# Patient Record
Sex: Male | Born: 1980 | Race: Black or African American | Hispanic: No | Marital: Single | Smoking: Never smoker
Health system: Southern US, Community
[De-identification: ages and names within clinical notes are randomized; demographics above are authoritative.]

---

## 2006-09-10 ENCOUNTER — Emergency Department (HOSPITAL_COMMUNITY): Admission: EM | Admit: 2006-09-10 | Discharge: 2006-09-10 | Payer: Self-pay | Admitting: Emergency Medicine

## 2006-09-17 ENCOUNTER — Ambulatory Visit: Payer: Self-pay | Admitting: Internal Medicine

## 2008-02-16 ENCOUNTER — Telehealth (INDEPENDENT_AMBULATORY_CARE_PROVIDER_SITE_OTHER): Payer: Self-pay | Admitting: *Deleted

## 2008-02-21 ENCOUNTER — Ambulatory Visit: Payer: Self-pay | Admitting: Internal Medicine

## 2008-02-21 DIAGNOSIS — F329 Major depressive disorder, single episode, unspecified: Secondary | ICD-10-CM

## 2008-02-21 DIAGNOSIS — K1120 Sialoadenitis, unspecified: Secondary | ICD-10-CM

## 2009-09-09 ENCOUNTER — Encounter: Payer: Self-pay | Admitting: Internal Medicine

## 2009-10-09 ENCOUNTER — Ambulatory Visit: Payer: Self-pay | Admitting: Internal Medicine

## 2009-10-09 DIAGNOSIS — K219 Gastro-esophageal reflux disease without esophagitis: Secondary | ICD-10-CM | POA: Insufficient documentation

## 2009-10-09 DIAGNOSIS — R079 Chest pain, unspecified: Secondary | ICD-10-CM | POA: Insufficient documentation

## 2009-10-09 DIAGNOSIS — F411 Generalized anxiety disorder: Secondary | ICD-10-CM | POA: Insufficient documentation

## 2010-05-01 NOTE — Assessment & Plan Note (Signed)
Summary: OV --LAST APPT W/DR JWJ:  2009---STC   Vital Signs:  Patient profile:   30 year old male Height:      70 inches Weight:      150.50 pounds BMI:     21.67 O2 Sat:      97 % on Room air Temp:     98.3 degrees F oral Pulse rate:   82 / minute BP sitting:   118 / 88  (left arm) Cuff size:   regular  Vitals Entered By: Zella Ball Ewing CMA Duncan Dull) (October 09, 2009 3:41 PM)  O2 Flow:  Room air CC: Chest problems/RE   CC:  Chest problems/RE.  History of Present Illness: here with intermittent, 2 months; mild left upper chest pain, "pinch-type" , no radiation, no worse with shoudler movement and nonpleuritic;  no nausea, sweats, palp's , diaphoresis, dizziness or syncope.  no fever, ST, or cough.Has some reflux symptoms about the same, without dysphagia, wt loss, night sweats,  or other constitituaional  symptoms.  Pain overall nonexertional.  ? maybe more stress lately.  works as Conservation officer, nature at [employer], job stable. overall doing ok socaily.  Denies incr depression, suicidal ideation, no panic.  Pt denies sob, doe, wheezing, orthopnea, pnd, worsening LE edema, palps, dizziness or syncope Pt denies new neuro symptoms such as headache, facial or extremity weakness   Preventive Screening-Counseling & Management      Drug Use:  no.    Problems Prior to Update: 1)  Anxiety  (ICD-300.00) 2)  Chest Pain  (ICD-786.50) 3)  Gerd  (ICD-530.81) 4)  Depression  (ICD-311) 5)  Parotitis, Left  (ICD-527.2)  Medications Prior to Update: 1)  Cephalexin 500 Mg Tabs (Cephalexin) .Marland Kitchen.. 1 By Mouth Three Times A Day  Current Medications (verified): 1)  Dexilant 60 Mg Cpdr (Dexlansoprazole) .Marland Kitchen.. 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   Head:  normocephalic and atraumatic.   Eyes:  vision grossly intact, pupils equal, and pupils round.   Ears:  R ear normal and L ear normal.   Nose:  no external deformity and no nasal discharge.   Mouth:  no gingival abnormalities and pharynx pink and moist.   Neck:  supple and no masses.   Lungs:  normal respiratory effort and normal breath sounds.   Heart:  normal rate and regular rhythm.   Abdomen:  soft, non-tender, and normal bowel sounds.   Msk:  no chest wall tender Extremities:  no edema, no erythema  Neurologic:  cranial nerves II-XII intact and strength normal in all extremities.   Psych:  dysphoric affect and moderately anxious.     Impression & Recommendations:  Problem # 1:  CHEST PAIN (ICD-786.50)  left upper chest, atypical, ? msk vs GI vs other - for ecg and cxr today, and trial dexliant samples, follow o/w with expectant management  Orders: EKG w/  Interpretation (93000) T-2 View CXR, Same Day (71020.5TC)  Problem # 2:  DEPRESSION (ICD-311) declines need for tx at this time  Problem # 3:  GERD (ICD-530.81)  His updated medication list for this problem includes:    Dexilant 60 Mg Cpdr (Dexlansoprazole) .Marland Kitchen... 1po once daily  - 1 mo samples given treat as above, f/u any worsening signs or symptoms   Complete Medication List: 1)  Dexilant 60 Mg Cpdr (Dexlansoprazole) .Marland Kitchen.. 1po once daily  - 1 mo samples given  Patient Instructions: 1)  you are given the samples of dexilant for reflux, to take at one per day 2)  if this seems to help, you should consider taking Prilosec OTC - 1 per  day after that 3)  Your EKG was ok today 4)  Please go to Radiology in the basement level for your X-Ray today  5)  Please schedule a follow-up appointment as needed.

## 2011-03-08 ENCOUNTER — Emergency Department (HOSPITAL_COMMUNITY): Payer: Self-pay

## 2011-03-08 ENCOUNTER — Emergency Department (HOSPITAL_COMMUNITY)
Admission: EM | Admit: 2011-03-08 | Discharge: 2011-03-08 | Disposition: A | Discharge status: Home / Self Care | Payer: Self-pay | Attending: Emergency Medicine | Admitting: Emergency Medicine

## 2011-03-08 ENCOUNTER — Encounter: Payer: Self-pay | Admitting: *Deleted

## 2011-03-08 DIAGNOSIS — M545 Low back pain, unspecified: Secondary | ICD-10-CM | POA: Insufficient documentation

## 2011-03-08 DIAGNOSIS — R109 Unspecified abdominal pain: Secondary | ICD-10-CM | POA: Insufficient documentation

## 2011-03-08 DIAGNOSIS — R110 Nausea: Secondary | ICD-10-CM | POA: Insufficient documentation

## 2011-03-08 DIAGNOSIS — F17200 Nicotine dependence, unspecified, uncomplicated: Secondary | ICD-10-CM | POA: Insufficient documentation

## 2011-03-08 DIAGNOSIS — N200 Calculus of kidney: Secondary | ICD-10-CM | POA: Insufficient documentation

## 2011-03-08 LAB — URINALYSIS, ROUTINE W REFLEX MICROSCOPIC
Hgb urine dipstick: NEGATIVE
Ketones, ur: 15 mg/dL — AB
Leukocytes, UA: NEGATIVE
Protein, ur: 30 mg/dL — AB
Specific Gravity, Urine: 1.03 (ref 1.005–1.030)
Urobilinogen, UA: 1 mg/dL (ref 0.0–1.0)
pH: 8 (ref 5.0–8.0)

## 2011-03-08 LAB — POCT I-STAT, CHEM 8
BUN: 12 mg/dL (ref 6–23)
HCT: 48 % (ref 39.0–52.0)
Hemoglobin: 16.3 g/dL (ref 13.0–17.0)
Sodium: 140 mEq/L (ref 135–145)
TCO2: 27 mmol/L (ref 0–100)

## 2011-03-08 LAB — URINE MICROSCOPIC-ADD ON

## 2011-03-08 MED ORDER — SODIUM CHLORIDE 0.9 % IV BOLUS (SEPSIS): 1000.0000 mL | Freq: Once | INTRAVENOUS | Status: DC

## 2011-03-08 MED ORDER — ONDANSETRON HCL 4 MG/2ML IJ SOLN
4.0000 mg | Freq: Once | INTRAMUSCULAR | Status: DC
  Filled 2011-03-08: qty 2

## 2011-03-08 MED ORDER — OXYCODONE-ACETAMINOPHEN 5-325 MG PO TABS
2.0000 | ORAL_TABLET | Freq: Once | ORAL | Status: AC
  Administered 2011-03-08: 2 via ORAL
  Filled 2011-03-08: qty 2

## 2011-03-08 MED ORDER — KETOROLAC TROMETHAMINE 60 MG/2ML IM SOLN: 30.0000 mg | Freq: Once | INTRAMUSCULAR | Status: AC

## 2011-03-08 MED ORDER — MORPHINE SULFATE 4 MG/ML IJ SOLN: 4.0000 mg | Freq: Once | INTRAMUSCULAR | Status: DC

## 2011-03-08 MED ORDER — KETOROLAC TROMETHAMINE 30 MG/ML IJ SOLN
30.0000 mg | Freq: Once | INTRAMUSCULAR | Status: DC
  Administered 2011-03-08: 30 mg via INTRAVENOUS
  Filled 2011-03-08: qty 1

## 2011-03-08 MED ORDER — OXYCODONE-ACETAMINOPHEN 5-325 MG PO TABS: 1.0000 | ORAL_TABLET | ORAL | Status: AC | PRN

## 2011-03-08 MED ORDER — TAMSULOSIN HCL 0.4 MG PO CAPS: 0.4000 mg | ORAL_CAPSULE | Freq: Every day | ORAL | Status: DC

## 2011-03-08 MED ORDER — ONDANSETRON 8 MG PO TBDP
8.0000 mg | ORAL_TABLET | Freq: Once | ORAL | Status: AC
  Administered 2011-03-08: 8 mg via ORAL
  Filled 2011-03-08: qty 1

## 2011-03-08 NOTE — ED Provider Notes (Signed)
History     CSN: [id number] Arrival date & time: 03/08/2011  2:14 PM   First MD Initiated Contact with Patient 03/08/11 1952      Chief Complaint  Patient presents with  . Flank Pain    Left    HPI  History provided by the patient. Patient presents with complaints of acute onset left lower back and flank pains starting about 2 hours prior to arrival. Patient denies similar symptoms in the past. Pain does not radiate. You sharp and persistent. he denies any aggravating factors. Patient has not taken anything for the pain. Patient has associated nausea but denies episodes of vomiting. he denies fever, chills, sweats, diarrhea or constipation. Patient has no other significant past medical history.   History reviewed. No pertinent past medical history.  History reviewed. No pertinent past surgical history.  History reviewed. No pertinent family history.  History  Substance Use Topics  . Smoking status: Current Everyday Smoker  . Smokeless tobacco: Not on file  . Alcohol Use: Yes      Review of Systems  Constitutional: Negative for fever and chills.  Respiratory: Negative for cough and shortness of breath.   Cardiovascular: Negative for chest pain.  Gastrointestinal: Positive for nausea. Negative for vomiting, abdominal pain, diarrhea and constipation.  Genitourinary: Positive for flank pain. Negative for dysuria, hematuria, penile pain and testicular pain.  All other systems reviewed and are negative.    Allergies  Review of patient's allergies indicates no known allergies.  Home Medications  No current outpatient prescriptions on file.  BP 133/74  Pulse 91  Temp(Src) 98.3 F (36.8 C) (Oral)  Resp 20  SpO2 100%  Physical Exam  Nursing note and vitals reviewed. Constitutional: He is oriented to person, place, and time. He appears well-developed and well-nourished. No distress.  HENT:  Head: Normocephalic.  Cardiovascular: Normal rate, regular rhythm and normal  heart sounds.   No murmur heard. Pulmonary/Chest: Effort normal and breath sounds normal.  Abdominal: Normal appearance. There is no tenderness. There is CVA tenderness. There is no rebound, no guarding, no tenderness at McBurney's point and negative Murphy's sign.  Neurological: He is alert and oriented to person, place, and time.  Skin: Skin is warm. No rash noted. No erythema.  Psychiatric: He has a normal mood and affect. His behavior is normal.    ED Course  Procedures (including critical care time)  Labs Reviewed  URINALYSIS, ROUTINE W REFLEX MICROSCOPIC - Abnormal; Notable for the following:    Ketones, ur 15 (*)    Protein, ur 30 (*)    All other components within normal limits  URINE MICROSCOPIC-ADD ON  I-STAT, CHEM 8    Results for orders placed during the hospital encounter of 03/08/11  URINALYSIS, ROUTINE W REFLEX MICROSCOPIC      Component Value Range   Color, Urine YELLOW  YELLOW    APPearance CLEAR  CLEAR    Specific Gravity, Urine 1.030  1.005 - 1.030    pH 8.0  5.0 - 8.0    Glucose, UA NEGATIVE  NEGATIVE (mg/dL)   Hgb urine dipstick NEGATIVE  NEGATIVE    Bilirubin Urine NEGATIVE  NEGATIVE    Ketones, ur 15 (*) NEGATIVE (mg/dL)   Protein, ur 30 (*) NEGATIVE (mg/dL)   Urobilinogen, UA 1.0  0.0 - 1.0 (mg/dL)   Nitrite NEGATIVE  NEGATIVE    Leukocytes, UA NEGATIVE  NEGATIVE   URINE MICROSCOPIC-ADD ON      Component Value Range   WBC,  UA 0-2  <3 (WBC/hpf)  POCT I-STAT, CHEM 8      Component Value Range   Sodium 140  135 - 145 (mEq/L)   Potassium 3.9  3.5 - 5.1 (mEq/L)   Chloride 102  96 - 112 (mEq/L)   BUN 12  6 - 23 (mg/dL)   Creatinine, Ser 9.14  0.50 - 1.35 (mg/dL)   Glucose, Bld 782 (*) 70 - 99 (mg/dL)   Calcium, Ion 9.56  2.13 - 1.32 (mmol/L)   TCO2 27  0 - 100 (mmol/L)   Hemoglobin 16.3  13.0 - 17.0 (g/dL)   HCT 08.6  57.8 - 46.9 (%)     Ct Abdomen Pelvis Wo Contrast  03/08/2011  *RADIOLOGY REPORT*  Clinical Data: Acute onset left flank pain.   CT ABDOMEN AND PELVIS WITHOUT CONTRAST 03/08/2011:  Technique:  Multidetector CT imaging of the abdomen and pelvis was performed following the standard protocol without intravenous contrast.  Comparison: None.  Findings: Approximate 3 mm calculus in the distal left ureter near the UVJ causing moderate left hydronephrosis, left renal edema, and left perinephric edema.  Numerous very small (1-2 mm) calculi in both kidneys.  No right urinary tract obstruction.  Within the limits of the unenhanced technique, no focal parenchymal abnormality involving either kidney.  Normal low dose unenhanced appearance of the liver, spleen, pancreas, and adrenal glands.  Gallbladder unremarkable by CT.  No biliary ductal dilation.  No visible atherosclerosis.  No significant lymphadenopathy.  Stomach normal in appearance.  Moderate stool within normal appearing colon.  Cecum extends into the low pelvis, with normal appearing appendix identified in the right low pelvis, just superior to the urinary bladder, filled with opaque material.  No ascites.  Urinary bladder decompressed and unremarkable.  Prostate gland seminal vesicles normal for age.  Phlebolith low in the left side of the pelvis.  Bone window images unremarkable.  Visualized lung bases clear.  Heart size normal.  IMPRESSION:  1.  Obstructing approximate 3 mm calculus in the distal left ureter near the UVJ. 2.  Multiple very small (1-2 mm) calculi in both kidneys.  Original Report Authenticated By: Arnell Sieving, M.D.     1. Kidney stone      MDM  8:10 PM patient seen and evaluated. Patient in no acute distress. Patient uncomfortable appearing.  Pt having some improvement of pain and ready to return home.       Angus Seller, [place] 03/09/11 1734

## 2011-03-08 NOTE — ED Notes (Signed)
Pt presented to ed c/o lt flank pain started approximately 4-5 hrs pta, denies nausea (-) vomiting, deneis urinary discomfort

## 2011-03-08 NOTE — ED Notes (Signed)
Pt with sudden onset of left flank pain an hour ago

## 2011-03-10 NOTE — ED Provider Notes (Signed)
Medical screening examination/treatment/procedure(s) were performed by non-physician practitioner and as supervising physician I was immediately available for consultation/collaboration.   Laray Anger, DO 03/10/11 [phone]

## 2021-04-19 ENCOUNTER — Emergency Department (HOSPITAL_COMMUNITY)
Admission: EM | Admit: 2021-04-19 | Discharge: 2021-04-19 | Disposition: A | Discharge status: Home / Self Care | Payer: Self-pay | Attending: Emergency Medicine | Admitting: Emergency Medicine

## 2021-04-19 ENCOUNTER — Encounter (HOSPITAL_COMMUNITY): Payer: Self-pay

## 2021-04-19 ENCOUNTER — Emergency Department (HOSPITAL_COMMUNITY): Payer: Self-pay

## 2021-04-19 ENCOUNTER — Other Ambulatory Visit: Payer: Self-pay

## 2021-04-19 DIAGNOSIS — R509 Fever, unspecified: Secondary | ICD-10-CM | POA: Insufficient documentation

## 2021-04-19 DIAGNOSIS — Z79899 Other long term (current) drug therapy: Secondary | ICD-10-CM | POA: Insufficient documentation

## 2021-04-19 DIAGNOSIS — N200 Calculus of kidney: Secondary | ICD-10-CM | POA: Insufficient documentation

## 2021-04-19 LAB — COMPREHENSIVE METABOLIC PANEL
ALT: 29 U/L (ref 0–44)
AST: 23 U/L (ref 15–41)
Albumin: 4.5 g/dL (ref 3.5–5.0)
Alkaline Phosphatase: 105 U/L (ref 38–126)
Anion gap: 10 (ref 5–15)
BUN: 15 mg/dL (ref 6–20)
CO2: 24 mmol/L (ref 22–32)
Calcium: 8.9 mg/dL (ref 8.9–10.3)
Chloride: 100 mmol/L (ref 98–111)
Creatinine, Ser: 1.02 mg/dL (ref 0.61–1.24)
GFR, Estimated: >60 mL/min (ref >60)
Glucose, Bld: 100 mg/dL — ABNORMAL HIGH (ref 70–99)
Potassium: 3.7 mmol/L (ref 3.5–5.1)
Sodium: 134 mmol/L — ABNORMAL LOW (ref 135–145)
Total Bilirubin: 0.9 mg/dL (ref 0.3–1.2)
Total Protein: 7.4 g/dL (ref 6.5–8.1)

## 2021-04-19 LAB — URINALYSIS, ROUTINE W REFLEX MICROSCOPIC
Bilirubin Urine: NEGATIVE
Glucose, UA: NEGATIVE mg/dL
Ketones, ur: 5 mg/dL — AB
Leukocytes,Ua: NEGATIVE
Nitrite: NEGATIVE
Protein, ur: 30 mg/dL — AB
RBC / HPF: >50 RBC/hpf — ABNORMAL HIGH (ref 0–5)
Specific Gravity, Urine: 1.017 (ref 1.005–1.030)
pH: 5 (ref 5.0–8.0)

## 2021-04-19 LAB — CBC WITH DIFFERENTIAL/PLATELET
Abs Immature Granulocytes: 0.06 10*3/uL (ref 0.00–0.07)
Basophils Absolute: 0.1 10*3/uL (ref 0.0–0.1)
Basophils Relative: 0 %
Eosinophils Absolute: 0.2 10*3/uL (ref 0.0–0.5)
Eosinophils Relative: 2 %
HCT: 45.2 % (ref 39.0–52.0)
Hemoglobin: 14.6 g/dL (ref 13.0–17.0)
Immature Granulocytes: 1 %
Lymphocytes Relative: 17 %
Lymphs Abs: 2.2 10*3/uL (ref 0.7–4.0)
MCH: 27.7 pg (ref 26.0–34.0)
MCHC: 32.3 g/dL (ref 30.0–36.0)
MCV: 85.6 fL (ref 80.0–100.0)
Monocytes Absolute: 0.6 10*3/uL (ref 0.1–1.0)
Monocytes Relative: 5 %
Neutro Abs: 9.9 10*3/uL — ABNORMAL HIGH (ref 1.7–7.7)
Neutrophils Relative %: 75 %
Platelets: 292 10*3/uL (ref 150–400)
RBC: 5.28 MIL/uL (ref 4.22–5.81)
RDW: 13.3 % (ref 11.5–15.5)
WBC: 13 10*3/uL — ABNORMAL HIGH (ref 4.0–10.5)
nRBC: 0 % (ref 0.0–0.2)

## 2021-04-19 MED ORDER — ONDANSETRON HCL 4 MG PO TABS: 4.0000 mg | ORAL_TABLET | Freq: Four times a day (QID) | ORAL | 0 refills | Status: AC

## 2021-04-19 MED ORDER — TAMSULOSIN HCL 0.4 MG PO CAPS: 0.4000 mg | ORAL_CAPSULE | Freq: Every day | ORAL | 0 refills | Status: AC

## 2021-04-19 MED ORDER — HYDROCODONE-ACETAMINOPHEN 5-325 MG PO TABS: 1.0000 | ORAL_TABLET | Freq: Four times a day (QID) | ORAL | 0 refills | Status: AC | PRN

## 2021-04-19 NOTE — Discharge Instructions (Addendum)
As we discussed your work-up today was significant for a 3 mm stone that is forming in the bottom of your left kidney, that should pass through your urine in the next few days. Please use Tylenol or ibuprofen for pain.  You may use 600 mg ibuprofen every 6 hours or 1000 mg of Tylenol every 6 hours.  You may choose to alternate between the 2.  This would be most effective.  Not to exceed 4 g of Tylenol within 24 hours.  Not to exceed 3200 mg ibuprofen 24 hours.  In addition to the above, you can use Norco in place of Tylenol as needed for breakthrough pain up to every 6 hours.  Please take the Flomax medication daily until you passed the stone, and follow-up with urology.  I have also provided some Zofran in case you have nausea.  Please return to the emergency department if you have significant worsening of your pain, inability urinate for greater than 8 to 12 hours despite feeling the need to.

## 2021-04-19 NOTE — ED Provider Notes (Signed)
[facility]-EMERGENCY DEPT Provider Note   CSN: [id number] Arrival date & time: 04/19/21  0402     History  Chief Complaint  Patient presents with   Flank Pain    Dan DuhamelKareem L Hall is a 41 y.o. male with past medical history significant for previous kidney stone who presents with left flank pain beginning at 230 this morning.  Patient reports that he does not have any nausea, vomiting, fever, chills associated with this.  Patient reports that it feels like previous kidney stones.  Patient reports he has been able to urinate with minimal pain, has actively passed urine since this pain began.  Patient denies some dark possible blood clots versus gross blood in urine.  Denies penile discharge, dyspareunia.  Patient denies diarrhea, constipation, chest pain, shortness of breath.   Flank Pain      Home Medications Prior to Admission medications   Medication Sig Start Date End Date Taking? Authorizing Provider  HYDROcodone-acetaminophen (NORCO/VICODIN) 5-325 MG tablet Take 1-2 tablets by mouth every 6 (six) hours as needed. 04/19/21  Yes Charlee Squibb H, PA-C  ondansetron (ZOFRAN) 4 MG tablet Take 1 tablet (4 mg total) by mouth every 6 (six) hours. 04/19/21  Yes Shawndra Clute H, PA-C  tamsulosin (FLOMAX) 0.4 MG CAPS capsule Take 1 capsule (0.4 mg total) by mouth daily. 04/19/21  Yes Jazmon Kos H, PA-C      Allergies    Patient has no known allergies.    Review of Systems   Review of Systems  Genitourinary:  Positive for flank pain.  All other systems reviewed and are negative.  Physical Exam Updated Vital Signs BP 118/74    Pulse 74    Temp 98.2 F (36.8 C) (Oral)    Resp 16    Ht 5\' 10"  (1.778 m)    Wt 68 kg    SpO2 99%    BMI 21.52 kg/m  Physical Exam Vitals and nursing note reviewed.  Constitutional:      General: He is not in acute distress.    Appearance: Normal appearance.     Comments: Well-appearing patient, no acute distress.   HENT:     Head: Normocephalic and atraumatic.  Eyes:     General:        Right eye: No discharge.        Left eye: No discharge.  Cardiovascular:     Rate and Rhythm: Normal rate and regular rhythm.     Heart sounds: No murmur heard.   No friction rub. No gallop.  Pulmonary:     Effort: Pulmonary effort is normal.     Breath sounds: Normal breath sounds.  Abdominal:     General: Bowel sounds are normal.     Palpations: Abdomen is soft.     Comments: Minimal tenderness to palpation left flank, no true CVA tenderness  Skin:    General: Skin is warm and dry.     Capillary Refill: Capillary refill takes less than 2 seconds.  Neurological:     Mental Status: He is alert and oriented to person, place, and time.  Psychiatric:        Mood and Affect: Mood normal.        Behavior: Behavior normal.    ED Results / Procedures / Treatments   Labs (all labs ordered are listed, but only abnormal results are displayed) Labs Reviewed  URINALYSIS, ROUTINE W REFLEX MICROSCOPIC - Abnormal; Notable for the following components:  Result Value   APPearance HAZY (*)    Hgb urine dipstick LARGE (*)    Ketones, ur 5 (*)    Protein, ur 30 (*)    RBC / HPF >50 (*)    Bacteria, UA RARE (*)    All other components within normal limits  COMPREHENSIVE METABOLIC PANEL - Abnormal; Notable for the following components:   Sodium 134 (*)    Glucose, Bld 100 (*)    All other components within normal limits  CBC WITH DIFFERENTIAL/PLATELET - Abnormal; Notable for the following components:   WBC 13.0 (*)    Neutro Abs 9.9 (*)    All other components within normal limits    EKG None  Radiology CT Renal Stone Study  Result Date: 04/19/2021 CLINICAL DATA:  Flank pain. EXAM: CT ABDOMEN AND PELVIS WITHOUT CONTRAST TECHNIQUE: Multidetector CT imaging of the abdomen and pelvis was performed following the standard protocol without IV contrast. RADIATION DOSE REDUCTION: This exam was performed according  to the departmental dose-optimization program which includes automated exposure control, adjustment of the mA and/or kV according to patient size and/or use of iterative reconstruction technique. COMPARISON:  CT AP, 03/08/2011. FINDINGS: Lower chest: No acute abnormality. Hepatobiliary: No focal liver abnormality is seen. No gallstones, gallbladder wall thickening, or biliary dilatation. Pancreas: No pancreatic ductal dilatation or surrounding inflammatory changes. Spleen: Normal in size without focal abnormality. Adrenals/Urinary Tract: *Adrenal glands are unremarkable. *Punctate, bilateral nephrolith largest at the LEFT inferior renal collecting system and measuring 3 mm *Kidneys are otherwise normal, without focal lesion, or hydronephrosis. *Bladder is unremarkable. Stomach/Bowel: Stomach is within normal limits. Appendix appears normal. No evidence of bowel wall thickening, distention, or inflammatory changes. Vascular/Lymphatic: No significant vascular findings are present. No enlarged abdominal or pelvic lymph nodes. Reproductive: Prostate is unremarkable.  LEFT pelvic phleboliths. Other: No abdominal wall hernia or abnormality. No abdominopelvic ascites. Musculoskeletal: No acute osseous findings. IMPRESSION: 1. Punctate, bilateral nonobstructive nephrolithiasis. 2. Largest calculus is within the LEFT inferior renal collecting system, and measures up to 3 mm. Electronically Signed   By: Roanna Banning M.D.   On: 04/19/2021 06:36    Procedures Procedures    Medications Ordered in ED Medications - No data to display  ED Course/ Medical Decision Making/ A&P                           Medical Decision Making Amount and/or Complexity of Data Reviewed Labs: ordered.  Risk Prescription drug management.   The patient presents with left flank, abdominal pain since 230 this morning.  They are overall well-appearing, not in acute pain at time of my exam.  Additional history obtained from friend.  My  differential diagnosis includes nephrolithiasis versus pyelonephritis versus diverticulitis versus other acute or surgical abdominal problem versus lower lobar pneumonia.  Less clinical concern for ACS without chest pain, exertional symptoms.  This is not an exhaustive differential.  I personally obtained and reviewed lab work which is significant for CBC which shows leukocytosis to 13, slight neutrophil predominance.  CMP with sodium of 134, glucose of 100, otherwise unremarkable.  Urinalysis is significant for large hemoglobin, rare bacteria without white blood cells or nitrates or leukocytes I do not believe this represents a bacterial infection.  Personally ordered and reviewed CT renal stone study which shows a 3 mm stone in the left inferior collecting system.  No evidence of obstructive symptoms.  Believe this represents an early kidney stone, with  imminent passage to the ureter.  We will treat as such.  No evidence of secondary infection.  Patient not in any pain at time of reevaluation.  Will discharge with some narcotic pain medication for easy passage, Flomax, Zofran.  Encourage follow-up with urology.  Encouraged return for obstructive uropathy, worsening pain, delayed passage.  Patient discharged in stable condition at this time, return precautions given. Final Clinical Impression(s) / ED Diagnoses Final diagnoses:  Nephrolithiasis    Rx / DC Orders ED Discharge Orders          Ordered    ondansetron (ZOFRAN) 4 MG tablet  Every 6 hours        04/19/21 0730    tamsulosin (FLOMAX) 0.4 MG CAPS capsule  Daily        04/19/21 0730    HYDROcodone-acetaminophen (NORCO/VICODIN) 5-325 MG tablet  Every 6 hours PRN        04/19/21 0730              Brayten Komar, [place] H, PA-C 04/19/21 0735    Cathren Laine, MD 04/19/21 1526

## 2021-04-19 NOTE — ED Triage Notes (Signed)
Pt reports left flank pain beginning 0230 this morning. Hx kidney stones.

## 2021-10-28 ENCOUNTER — Emergency Department (HOSPITAL_COMMUNITY)
Admission: EM | Admit: 2021-10-28 | Discharge: 2021-10-28 | Disposition: A | Discharge status: Home / Self Care | Payer: Self-pay | Attending: Emergency Medicine | Admitting: Emergency Medicine

## 2021-10-28 ENCOUNTER — Other Ambulatory Visit: Payer: Self-pay

## 2021-10-28 ENCOUNTER — Encounter (HOSPITAL_COMMUNITY): Payer: Self-pay

## 2021-10-28 DIAGNOSIS — H00011 Hordeolum externum right upper eyelid: Secondary | ICD-10-CM

## 2021-10-28 MED ORDER — ERYTHROMYCIN 5 MG/GM OP OINT: TOPICAL_OINTMENT | OPHTHALMIC | 0 refills | Status: DC

## 2021-10-28 NOTE — Discharge Instructions (Signed)
You were seen in the emergency department for a hordeolum. I have prescribed you erythromycin ointment for you to use over the eyelid. Additionally, please practice good hand hygiene, avoid any eye makeup or contacts and use warm compresses to the eyelid multiple times a day with gentle massage. Please return for significantly worsening redness and swelling or changes to your vision.

## 2021-10-28 NOTE — ED Triage Notes (Signed)
Patient has a raised area and redness to the right upperlid x 3 days.

## 2021-10-28 NOTE — ED Provider Triage Note (Signed)
Emergency Medicine Provider Triage Evaluation Note  Dan Hall , a 41 y.o. male  was evaluated in triage.  Pt complains of right eye pain. Began on Friday. Then woke up Saturday with redness and pain to the upper eyelid. No eye drainage, itching, fevers, or pain to the left eye. No FB sensation.  Review of Systems  Positive:  Negative:   Physical Exam  BP 123/76 (BP Location: Right Arm)   Pulse 77   Temp 98.2 F (36.8 C) (Oral)   Resp 18   Ht 5\' 10"  (1.778 m)   Wt 63.5 kg   SpO2 99%   BMI 20.09 kg/m  Gen:   Awake, no distress   Resp:  Normal effort  MSK:   Moves extremities without difficulty  Other:  Hordeolum to right upper eyelid   Medical Decision Making  Medically screening exam initiated at 1:55 PM.  Appropriate orders placed.  was informed that the remainder of the evaluation will be completed by another provider, this initial triage assessment does not replace that evaluation, and the importance of remaining in the ED until their evaluation is complete.     Mickel Duhamel, PA-C 10/28/21 1357

## 2021-10-28 NOTE — ED Provider Notes (Signed)
[facility]-EMERGENCY DEPT Provider Note   CSN: [id number] Arrival date & time: 10/28/21  1243     History  Chief Complaint  Patient presents with   Eye Pain    Dan Hall is a 41 y.o. male.  With no pertinent past medical history presents to the emergency department with right eye pain.  Patient states that the pain began on the upper eyelid of the right eye on Friday.  He states that he then woke up on Saturday morning with redness and swelling to the upper eyelid.  He has noticed a bump on the area that is tender to palpation.  He denies any eye drainage, itching, fevers or foreign body sensation.  He denies any visual changes.  HPI     Home Medications Prior to Admission medications   Medication Sig Start Date End Date Taking? Authorizing Provider  HYDROcodone-acetaminophen (NORCO/VICODIN) 5-325 MG tablet Take 1-2 tablets by mouth every 6 (six) hours as needed. 04/19/21   Prosperi, Christian H, PA-C  ondansetron (ZOFRAN) 4 MG tablet Take 1 tablet (4 mg total) by mouth every 6 (six) hours. 04/19/21   Prosperi, Christian H, PA-C  tamsulosin (FLOMAX) 0.4 MG CAPS capsule Take 1 capsule (0.4 mg total) by mouth daily. 04/19/21   Prosperi, Christian H, PA-C      Allergies    Patient has no known allergies.    Review of Systems   Review of Systems  Eyes:  Positive for pain and redness.  All other systems reviewed and are negative.   Physical Exam Updated Vital Signs BP 123/76 (BP Location: Right Arm)   Pulse 77   Temp 98.2 F (36.8 C) (Oral)   Resp 18   Ht 5\' 10"  (1.778 m)   Wt 63.5 kg   SpO2 99%   BMI 20.09 kg/m  Physical Exam Vitals and nursing note reviewed.  HENT:     Head: Normocephalic and atraumatic.  Eyes:     General: No scleral icterus.       Right eye: Hordeolum present.     Comments: Hordeolum to the right upper lid  Pulmonary:     Effort: Pulmonary effort is normal. No respiratory distress.  Skin:    Findings: No rash.   Neurological:     General: No focal deficit present.     Mental Status: He is alert.  Psychiatric:        Mood and Affect: Mood normal.        Behavior: Behavior normal.        Thought Content: Thought content normal.        Judgment: Judgment normal.    ED Results / Procedures / Treatments   Labs (all labs ordered are listed, but only abnormal results are displayed) Labs Reviewed - No data to display  EKG None  Radiology No results found.  Procedures Procedures    Medications Ordered in ED Medications - No data to display  ED Course/ Medical Decision Making/ A&P                           Medical Decision Making Risk Prescription drug management.  This patient presents to the ED for concern of eye pain, this involves an extensive number of treatment options, and is a complaint that carries with it a high risk of complications and morbidity.  The differential diagnosis includes conjunctivitis, corneal abrasion, corneal ulcer, stye, chalazion, acute angle-closure glaucoma, cataract, hypopyon, hyphema,  globe rupture, foreign body, etc.  Co morbidities that complicate the patient evaluation None  Additional history obtained:  Additional history obtained from: None External records from outside source obtained and reviewed including: None  Medications  -I reviewed the patient's home medications and did not make adjustments. -I did  prescribe new home medications.  Tests Considered: N/A  Critical Interventions: N/A  Consultations: N/A  SDH NA  ED Course:  41 year old male who presents to the emergency department with redness and pain to the right eyelid. Physical exam is consistent with a hordeolum There is no dacryocystitis present.  There is no conjunctival redness or chemosis.  He has no drainage from the eye.  There is no evidence of periorbital cellulitis or orbital cellulitis, globe rupture, foreign body, hypopyon.  Symptoms inconsistent with an acute  angle-closure glaucoma, retinal detachment or other eye emergency. No indication at this time to perform a fluorescein uptake exam as it is external to the eye.  We will provide him with erythromycin ointment.  He is instructed to use warm compresses multiple times a day with gentle massage to facilitate drainage.  Can use Tylenol or Motrin for pain.  He verbalized understanding.  After consideration of the diagnostic results and the patients response to treatment, I feel that the patent would benefit from discharge. The patient has been appropriately medically screened and/or stabilized in the ED. I have low suspicion for any other emergent medical condition which would require further screening, evaluation or treatment in the ED or require inpatient management. The patient is overall well appearing and non-toxic in appearance. They are hemodynamically stable at time of discharge.   Final Clinical Impression(s) / ED Diagnoses Final diagnoses:  Hordeolum externum of right upper eyelid    Rx / DC Orders ED Discharge Orders          Ordered    erythromycin ophthalmic ointment        10/28/21 1404              Cristopher Peru, PA-C 10/28/21 1730    Kneller, Mont[place] K, DO 10/29/21 1645

## 2022-08-13 ENCOUNTER — Telehealth: Payer: Self-pay

## 2022-08-13 ENCOUNTER — Ambulatory Visit: Payer: Self-pay | Admitting: Family Medicine

## 2022-08-13 NOTE — Telephone Encounter (Signed)
Pt was a no show for a NP appt with Dr Veto Kemps, I did send a no show letter.

## 2022-08-26 NOTE — Telephone Encounter (Signed)
Pt called and allowed to reschedule new pt appt x1

## 2022-08-30 ENCOUNTER — Ambulatory Visit
Admission: EM | Admit: 2022-08-30 | Discharge: 2022-08-30 | Disposition: A | Discharge status: Home / Self Care | Payer: Self-pay | Attending: Nurse Practitioner | Admitting: Nurse Practitioner

## 2022-08-30 DIAGNOSIS — H00015 Hordeolum externum left lower eyelid: Secondary | ICD-10-CM

## 2022-08-30 MED ORDER — ERYTHROMYCIN 5 MG/GM OP OINT: TOPICAL_OINTMENT | OPHTHALMIC | 0 refills | Status: AC

## 2022-08-30 NOTE — Discharge Instructions (Signed)
Start erythromycin ointment as prescribed Continue warm compresses Follow-up with your PCP if your symptoms do not improve Please go to the ER for any worsening symptoms

## 2022-08-30 NOTE — ED Provider Notes (Signed)
[facility]    CSN: [id number] Arrival date & time: 08/30/22  1335      History   Chief Complaint No chief complaint on file.   HPI Dan Hall is a 42 y.o. male presents for evaluation of left lower eyelid redness and swelling for 3 days.  Reports he feels a little bump.  Denies injury to the eye, drainage from the eyelid or eye, visual changes, URI symptoms.  No glasses.  Has a history of styes in the past.  Been doing warm compresses without improvement.  No other concerns at this time.  HPI  History reviewed. No pertinent past medical history.  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 No pertinent surgical history.     Home Medications    Prior to Admission medications   Medication Sig Start Date End Date Taking? Authorizing Provider  erythromycin ophthalmic ointment Place a 1/2 inch ribbon of ointment into the left lower eyelid 3 times a day for 7 days 08/30/22  Yes Radford Pax, NP  HYDROcodone-acetaminophen (NORCO/VICODIN) 5-325 MG tablet Take 1-2 tablets by mouth every 6 (six) hours as needed. 04/19/21   Prosperi, Christian H, PA-C  ondansetron (ZOFRAN) 4 MG tablet Take 1 tablet (4 mg total) by mouth every 6 (six) hours. 04/19/21   Prosperi, Christian H, PA-C  tamsulosin (FLOMAX) 0.4 MG CAPS capsule Take 1 capsule (0.4 mg total) by mouth daily. 04/19/21   Prosperi, Harrel Carina, PA-C    Family History Family History  Problem Relation Age of Onset   Healthy Mother     Social History Social History   Tobacco Use   Smoking status: Never   Smokeless tobacco: Never  Vaping Use   Vaping Use: Former   Substances: Nicotine, Flavoring  Substance Use Topics   Alcohol use: Yes    Comment: occ   Drug use: Yes    Types: Marijuana     Allergies   Patient has no known allergies.   Review of  Systems Review of Systems  Eyes:        Lower eyelid swelling     Physical Exam Triage Vital Signs ED Triage Vitals  Enc Vitals Group     BP 08/30/22 1517 133/84     Pulse Rate 08/30/22 1517 79     Resp 08/30/22 1517 16     Temp 08/30/22 1517 98.3 F (36.8 C)     Temp Source 08/30/22 1517 Oral     SpO2 08/30/22 1517 98 %     Weight --      Height --      Head Circumference --      Peak Flow --      Pain Score 08/30/22 1519 3     Pain Loc --      Pain Edu? --      Excl. in GC? --    No data found.  Updated Vital Signs BP 133/84 (BP Location: Right Arm)   Pulse 79   Temp 98.3 F (36.8 C) (Oral)   Resp 16   SpO2 98%   Visual Acuity Right Eye Distance:   Left Eye Distance:   Bilateral Distance:    Right Eye Near:   Left Eye Near:    Bilateral Near:     Physical Exam Vitals and nursing note  reviewed.  Constitutional:      General: He is not in acute distress.    Appearance: Normal appearance. He is not ill-appearing.  HENT:     Head: Normocephalic and atraumatic.  Eyes:     Pupils: Pupils are equal, round, and reactive to light.      Comments: Mild swelling of the left outer lower eyelid with mild erythema.  Small stye noted.  No periorbital swelling  Cardiovascular:     Rate and Rhythm: Normal rate.  Pulmonary:     Effort: Pulmonary effort is normal.  Skin:    General: Skin is warm and dry.  Neurological:     General: No focal deficit present.     Mental Status: He is alert and oriented to person, place, and time.  Psychiatric:        Mood and Affect: Mood normal.        Behavior: Behavior normal.      UC Treatments / Results  Labs (all labs ordered are listed, but only abnormal results are displayed) Labs Reviewed - No data to display  EKG   Radiology No results found.  Procedures Procedures (including critical care time)  Medications Ordered in UC Medications - No data to display  Initial Impression / Assessment and Plan / UC  Course  I have reviewed the triage vital signs and the nursing notes.  Pertinent labs & imaging results that were available during my care of the patient were reviewed by me and considered in my medical decision making (see chart for details).     Start erythromycin ointment Warm compresses as needed PCP follow-up if symptoms do not improve ER precautions reviewed and patient verbalized understanding Final Clinical Impressions(s) / UC Diagnoses   Final diagnoses:  Hordeolum externum of left lower eyelid     Discharge Instructions      Start erythromycin ointment as prescribed Continue warm compresses Follow-up with your PCP if your symptoms do not improve Please go to the ER for any worsening symptoms   ED Prescriptions     Medication Sig Dispense Auth. Provider   erythromycin ophthalmic ointment Place a 1/2 inch ribbon of ointment into the left lower eyelid 3 times a day for 7 days 3.5 g Radford Pax, NP      PDMP not reviewed this encounter.   Radford Pax, NP 08/30/22 1531

## 2022-08-30 NOTE — ED Triage Notes (Signed)
Pt presents to UC w/ c/o left lower eyelid pain x3 days. Denies drainage or blurred vision.

## 2022-09-08 ENCOUNTER — Ambulatory Visit: Payer: Self-pay | Admitting: Family Medicine

## 2022-09-08 ENCOUNTER — Telehealth: Payer: Self-pay | Admitting: Family Medicine

## 2022-09-08 NOTE — Telephone Encounter (Signed)
Pt arrived approx 15 min late. Pt previously canceled NP appt on same day. Pt advised he is unable to reschedule with our office.

## 2023-02-12 IMAGING — CT CT RENAL STONE PROTOCOL
2 of 4 series · 16 of 46 positions shown, 18 images · non-contrast
Comparison: CT AP, 03/08/2011.

CLINICAL DATA: Flank pain.



[Series 2: axial st · axial · 0.63mm/px · z∈[-508,-118]mm · 13 of 88 slices shown, 15 images]
[im 5/88  soft-tissue]
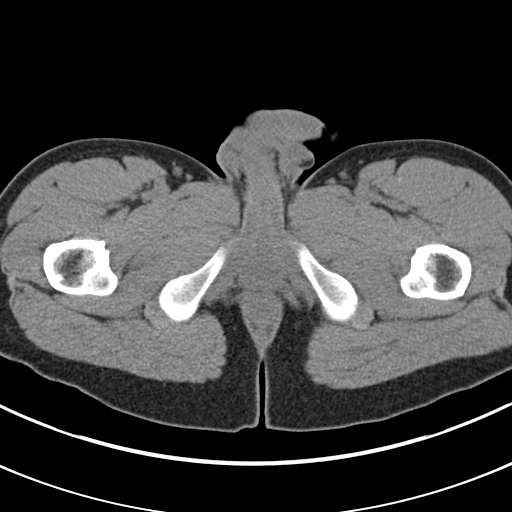
[im 5/88  bone]
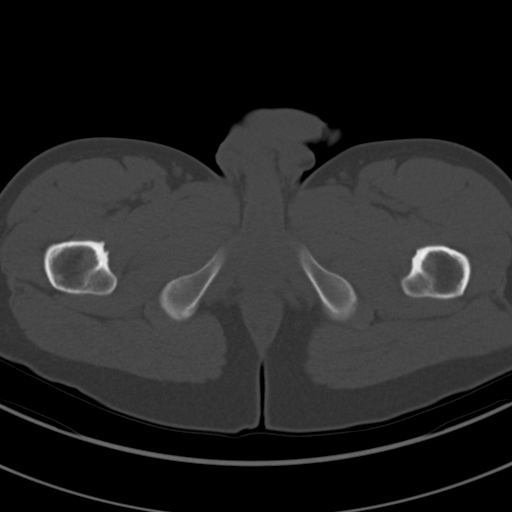
[im 13/88  soft-tissue]
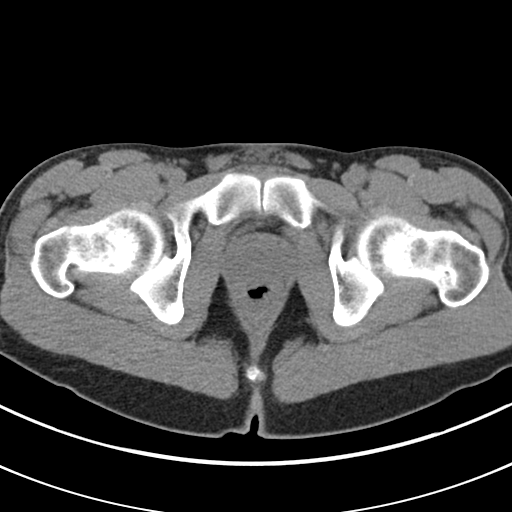
[im 17/88  soft-tissue]
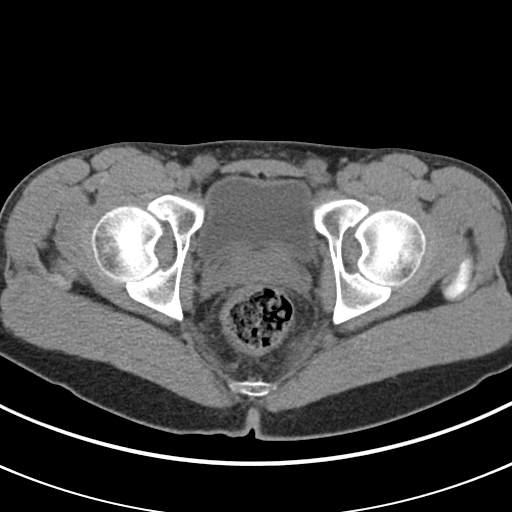
[im 25/88  soft-tissue]
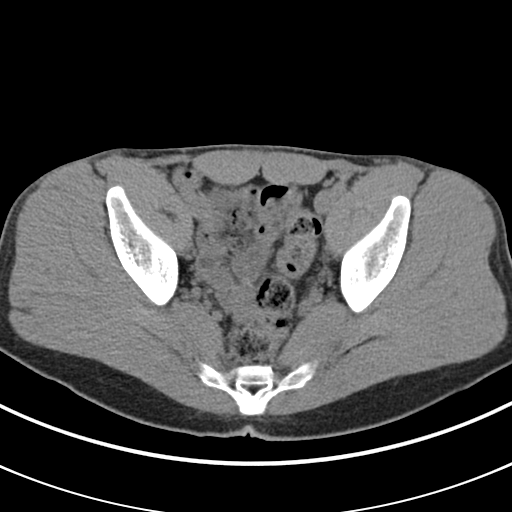
[im 30/88  soft-tissue]
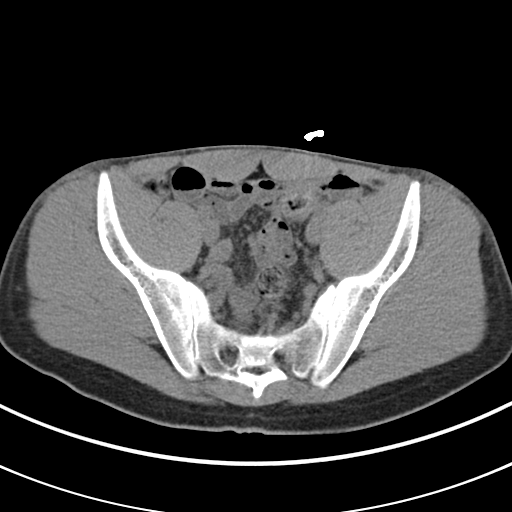
[im 38/88  soft-tissue]
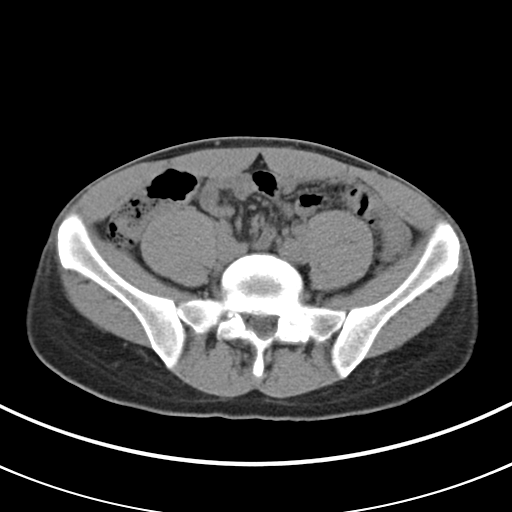
[im 46/88  soft-tissue]
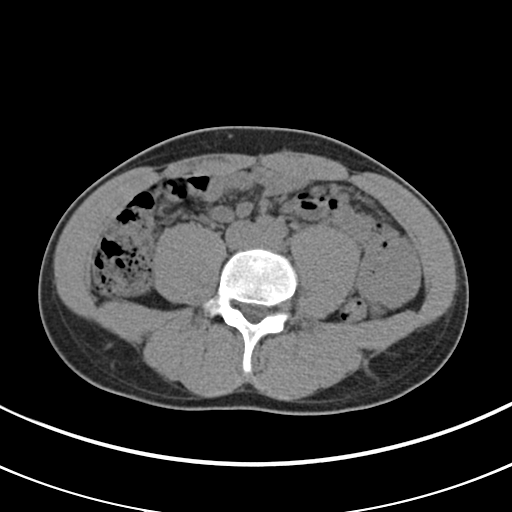
[im 50/88  soft-tissue]
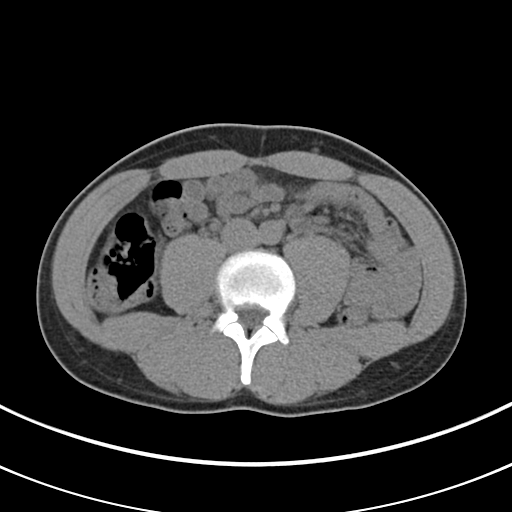
[im 59/88  soft-tissue]
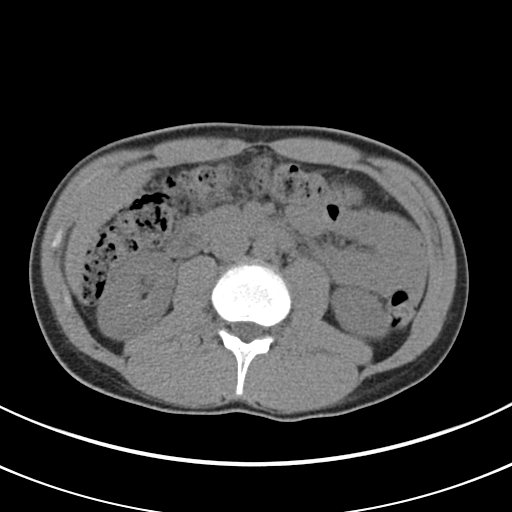
[im 59/88  bone]
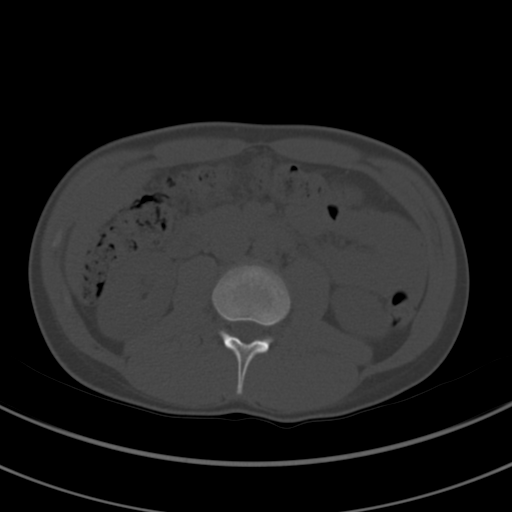
[im 63/88  soft-tissue]
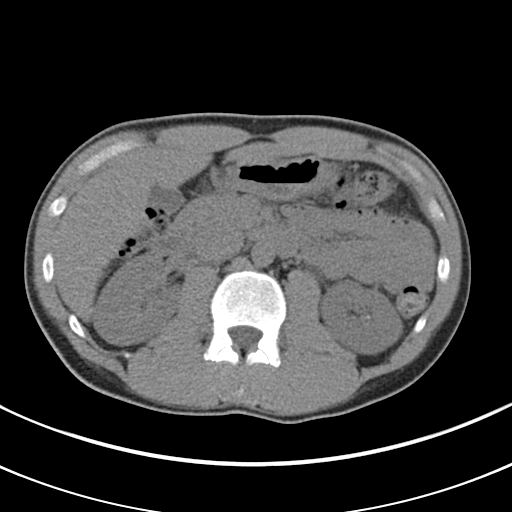
[im 71/88  soft-tissue]
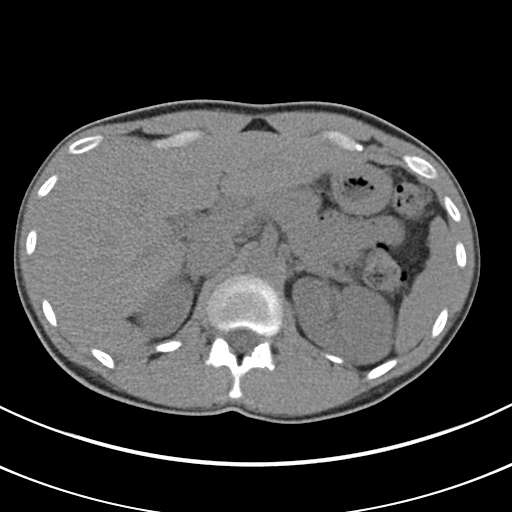
[im 75/88  soft-tissue]
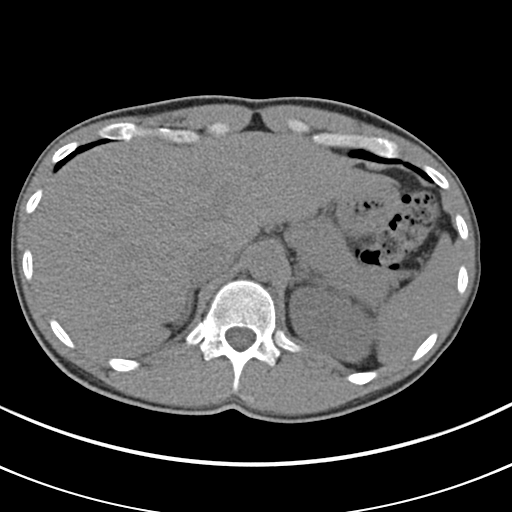
[im 83/88  soft-tissue]
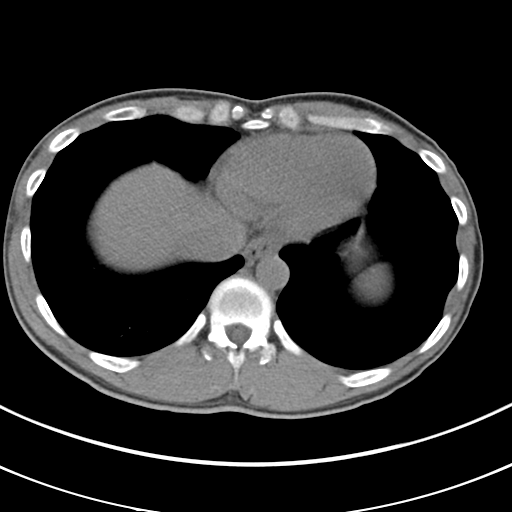

[Series 4: coronal · coronal · 0.71mm/px · 3 of 106 slices shown]
[im 36/106  soft-tissue]
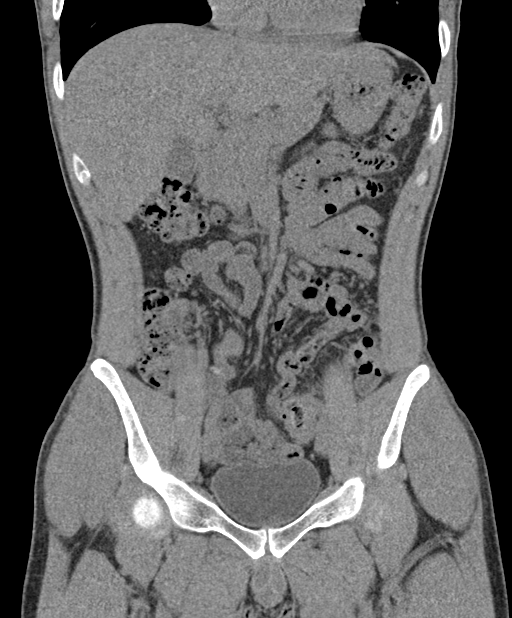
[im 47/106  soft-tissue]
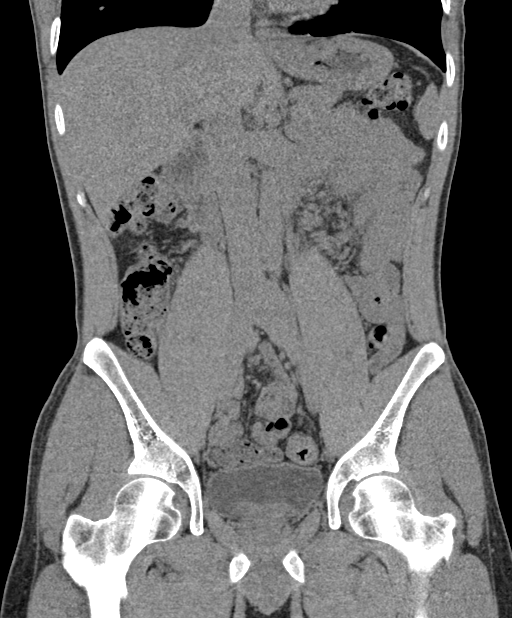
[im 59/106  soft-tissue]
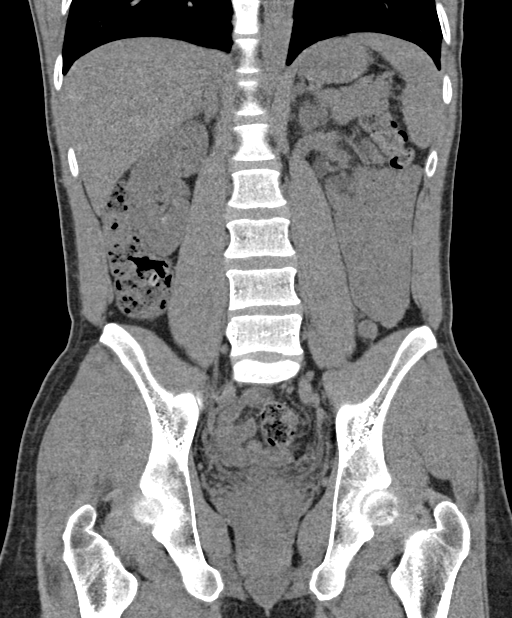

[16 of 46 positions shown; findings below may reference images not displayed]

FINDINGS: Lower chest: No acute abnormality.

Hepatobiliary: No focal liver abnormality is seen. No gallstones,
gallbladder wall thickening, or biliary dilatation.

Pancreas: No pancreatic ductal dilatation or surrounding
inflammatory changes.

Spleen: Normal in size without focal abnormality.

Adrenals/Urinary Tract:

*Adrenal glands are unremarkable.
*Punctate, bilateral nephrolith largest at the LEFT inferior renal
collecting system and measuring 3 mm
*Kidneys are otherwise normal, without focal lesion, or
hydronephrosis.
*Bladder is unremarkable.

Stomach/Bowel: Stomach is within normal limits. Appendix appears
normal. No evidence of bowel wall thickening, distention, or
inflammatory changes.

Vascular/Lymphatic: No significant vascular findings are present. No
enlarged abdominal or pelvic lymph nodes.

Reproductive: Prostate is unremarkable.  LEFT pelvic phleboliths.

Other: No abdominal wall hernia or abnormality. No abdominopelvic
ascites.

Musculoskeletal: No acute osseous findings.
IMPRESSION: 1. Punctate, bilateral nonobstructive nephrolithiasis.
2. Largest calculus is within the LEFT inferior renal collecting
system, and measures up to 3 mm.

## 2023-05-27 ENCOUNTER — Ambulatory Visit
Admission: EM | Admit: 2023-05-27 | Discharge: 2023-05-27 | Disposition: A | Discharge status: Home / Self Care | Payer: Self-pay | Attending: Family Medicine | Admitting: Family Medicine

## 2023-05-27 DIAGNOSIS — G510 Bell's palsy: Secondary | ICD-10-CM

## 2023-05-27 NOTE — ED Triage Notes (Signed)
 Patient presents to the office with c/o  right cheek thinner than left cheek x 1 month.. Denies any trauma, pain,fever,numbness or tingling in his cheekbone.  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

## 2023-05-27 NOTE — Discharge Instructions (Addendum)
 Scheduled appointment with neurology asap for further evaluation of your ongoing bell's palsy symptoms

## 2023-05-27 NOTE — ED Provider Notes (Addendum)
 [facility]    CSN: [id number] Arrival date & time: 05/27/23  6213      History   Chief Complaint Chief Complaint  Patient presents with   cheek bone    HPI Dan Hall is a 43 y.o. male presents for evaluation of concern for his cheeks.  Patient reports over the past 10 years he has been told he has "nerve damage" to the right side of his face due to a neck issue.  States he has been seen by chiropractors and has had x-rays of his neck.  Reports symptoms began 10 years ago and started out with him being unable to close his right eye or raise his eyebrow.  States the symptoms have persisted, somewhat improved but still persistent.  States he no longer has to tape his eye closed to sleep and can close his eye with effort,  but is still unable to raise his right eyebrow or smile.  Denies any unilateral weakness of his body.  Also denies any acute symptoms.  No chest pain or shortness of breath, HA, visual changes..  He has never seen neurology or PCP for this.  States about a month ago he noticed the volume of his cheek on his right side seemed less than on his left.  No injury.  He does not currently have a PCP but is a new patient appointment in March.  No other concerns at this time.  HPI  History reviewed. No pertinent past medical history.  Patient Active Problem List   Diagnosis Date Noted   Anxiety state 10/09/2009   GERD 10/09/2009   CHEST PAIN 10/09/2009   DEPRESSION 02/21/2008   PAROTITIS, LEFT 02/21/2008    History reviewed. No pertinent surgical history.     Home Medications    Prior to Admission medications   Medication Sig Start Date End Date Taking? Authorizing Provider  erythromycin ophthalmic ointment Place a 1/2 inch ribbon of ointment into the left lower eyelid 3 times a day for 7 days 08/30/22   Radford Pax, NP  HYDROcodone-acetaminophen (NORCO/VICODIN) 5-325 MG tablet Take 1-2 tablets by mouth every 6 (six) hours as needed. 04/19/21    Prosperi, Christian H, PA-C  ondansetron (ZOFRAN) 4 MG tablet Take 1 tablet (4 mg total) by mouth every 6 (six) hours. 04/19/21   Prosperi, Christian H, PA-C  tamsulosin (FLOMAX) 0.4 MG CAPS capsule Take 1 capsule (0.4 mg total) by mouth daily. 04/19/21   Prosperi, Harrel Carina, PA-C    Family History Family History  Problem Relation Age of Onset   Healthy Mother     Social History Social History   Tobacco Use   Smoking status: Never   Smokeless tobacco: Never  Vaping Use   Vaping status: Former   Substances: Nicotine, Flavoring  Substance Use Topics   Alcohol use: Yes    Comment: occ   Drug use: Yes    Types: Marijuana     Allergies   Patient has no known allergies.   Review of Systems Review of Systems  Neurological:  Positive for facial asymmetry.     Physical Exam Triage Vital Signs ED Triage Vitals [05/27/23 1003]  Encounter Vitals Group     BP 122/77     Systolic BP Percentile      Diastolic BP Percentile      Pulse Rate 98     Resp 16     Temp 98.3 F (36.8 C)     Temp Source Oral  SpO2 98 %     Weight      Height      Head Circumference      Peak Flow      Pain Score      Pain Loc      Pain Education      Exclude from Growth Chart    No data found.  Updated Vital Signs BP 122/77 (BP Location: Left Arm)   Pulse 98   Temp 98.3 F (36.8 C) (Oral)   Resp 16   SpO2 98%   Visual Acuity Right Eye Distance:   Left Eye Distance:   Bilateral Distance:    Right Eye Near:   Left Eye Near:    Bilateral Near:     Physical Exam Vitals and nursing note reviewed.  Constitutional:      General: He is not in acute distress.    Appearance: Normal appearance. He is not ill-appearing.  HENT:     Head: Normocephalic and atraumatic.  Eyes:     Extraocular Movements: Extraocular movements intact.     Conjunctiva/sclera: Conjunctivae normal.     Pupils: Pupils are equal, round, and reactive to light.  Cardiovascular:     Rate and Rhythm: Normal  rate.  Pulmonary:     Effort: Pulmonary effort is normal.  Skin:    General: Skin is warm and dry.  Neurological:     General: No focal deficit present.     Mental Status: He is alert and oriented to person, place, and time.     Cranial Nerves: Facial asymmetry present.     Motor: No weakness.     Coordination: Romberg sign negative. Finger-Nose-Finger Test normal.     Gait: Gait is intact.     Comments: Patient unable to raise right eyebrow or right corner of mouth.  He is able to close his right eye with some effort.  Psychiatric:        Mood and Affect: Mood normal.        Behavior: Behavior normal.      UC Treatments / Results  Labs (all labs ordered are listed, but only abnormal results are displayed) Labs Reviewed - No data to display  EKG   Radiology No results found.  Procedures Procedures (including critical care time)  Medications Ordered in UC Medications - No data to display  Initial Impression / Assessment and Plan / UC Course  I have reviewed the triage vital signs and the nursing notes.  Pertinent labs & imaging results that were available during my care of the patient were reviewed by me and considered in my medical decision making (see chart for details).     I reviewed exam and symptoms with patient.  Discussed symptoms seem consistent with Bell's palsy.  Patient denies any acute symptoms and states this has been his baseline for 10 years.  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  Discussed going to ER for further workup but he declines.  I highly recommend that he see a neurologist given his symptoms and I did give him contact information for this.  Will follow-up with his PCP to schedule an appointment next month.  I did instruct him to go to the ER for any worsening symptoms, red flags reviewed and patient verbalized understanding. Final Clinical  Impressions(s) / UC Diagnoses   Final diagnoses:  Bell's palsy     Discharge  Instructions      Scheduled appointment with neurology asap for further evaluation of your ongoing bell's palsy symptoms    ED Prescriptions   None    PDMP not reviewed this encounter.   Radford Pax, NP 05/27/23 1225    Radford Pax, NP 05/27/23 1300

## 2023-06-09 ENCOUNTER — Other Ambulatory Visit: Payer: Self-pay | Admitting: Family Medicine

## 2023-06-09 DIAGNOSIS — E010 Iodine-deficiency related diffuse (endemic) goiter: Secondary | ICD-10-CM

## 2023-06-10 ENCOUNTER — Ambulatory Visit
Admission: RE | Admit: 2023-06-10 | Discharge: 2023-06-10 | Discharge status: Home / Self Care | Payer: Self-pay | Source: Ambulatory Visit | Attending: Family Medicine | Admitting: Family Medicine

## 2023-06-10 DIAGNOSIS — E010 Iodine-deficiency related diffuse (endemic) goiter: Secondary | ICD-10-CM

## 2023-08-04 ENCOUNTER — Telehealth: Payer: Self-pay | Admitting: Neurology

## 2023-08-04 NOTE — Telephone Encounter (Signed)
 R/S appointment due to provider not available

## 2023-08-05 ENCOUNTER — Ambulatory Visit: Payer: Self-pay | Admitting: Neurology

## 2023-08-10 ENCOUNTER — Ambulatory Visit: Payer: Self-pay | Admitting: Neurology

## 2023-08-27 DIAGNOSIS — G510 Bell's palsy: Secondary | ICD-10-CM | POA: Insufficient documentation

## 2023-08-27 DIAGNOSIS — E010 Iodine-deficiency related diffuse (endemic) goiter: Secondary | ICD-10-CM | POA: Insufficient documentation

## 2023-08-31 ENCOUNTER — Encounter: Payer: Self-pay | Admitting: Neurology

## 2023-08-31 ENCOUNTER — Ambulatory Visit (INDEPENDENT_AMBULATORY_CARE_PROVIDER_SITE_OTHER): Payer: Self-pay | Admitting: Neurology

## 2023-08-31 VITALS — BP 138/83 | HR 71 | Ht 71.0 in | Wt 154.0 lb

## 2023-08-31 DIAGNOSIS — G510 Bell's palsy: Secondary | ICD-10-CM

## 2023-08-31 NOTE — Progress Notes (Signed)
 [facility]  PATIENT: Dan Hall DOB: November 05, 1980  REQUESTING CLINICIAN: Duwayne Ginsberg, NP HISTORY FROM: Patient  REASON FOR VISIT: Bell's Palsy   HISTORICAL  CHIEF COMPLAINT:  Chief Complaint  Patient presents with   New Patient (Initial Visit)    RM 12. Alone. NP proficient paper referral for Right-sided Bell's Palsy: pt reports having bells palsy for over 10 years. Pt reports no changes or issues currently but is looking to see if there is anything he can do for the bells palsy to make it better.     HISTORY OF PRESENT ILLNESS:  This is a 43 year old gentleman past medical history of Bell's palsy who is presenting for evaluation of his Bell's palsy.  He tells me that he had Bell's palsy, right-sided face, diagnosed more than 10 years ago.  At diagnosis he was given prednisone.  He did have some recovery but it was incomplete.  He remembered at first, he was not able to tape his eyes but he no longer does that.  Tells me that he followed with his PCP who  referred him to neurology for evaluation of the Bell's palsy but he he does not have any specific questions for me.  Denies any episode of recurrent Bell's palsy, denies any history of Bell's palsy on the left.    OTHER MEDICAL CONDITIONS: None reported    REVIEW OF SYSTEMS: Full 14 system review of systems performed and negative with exception of: As noted in the HPI   ALLERGIES: No Known Allergies  HOME MEDICATIONS: Outpatient Medications Prior to Visit  Medication Sig Dispense Refill   ondansetron  (ZOFRAN ) 4 MG tablet Take 1 tablet (4 mg total) by mouth every 6 (six) hours. 12 tablet 0   tamsulosin  (FLOMAX ) 0.4 MG CAPS capsule Take 1 capsule (0.4 mg total) by mouth daily. 30 capsule 0   erythromycin  ophthalmic ointment Place a 1/2 inch ribbon of ointment into the left lower eyelid 3 times a day for 7 days (Patient not taking: Reported on 08/31/2023) 3.5 g 0   HYDROcodone -acetaminophen   (NORCO/VICODIN) 5-325 MG tablet Take 1-2 tablets by mouth every 6 (six) hours as needed. (Patient not taking: Reported on 08/31/2023) 10 tablet 0   No facility-administered medications prior to visit.    PAST MEDICAL HISTORY: History reviewed. No pertinent past medical history.  PAST SURGICAL HISTORY: History reviewed. No pertinent surgical history.  FAMILY HISTORY: Family History  Problem Relation Age of Onset   Healthy Mother     SOCIAL HISTORY: Social History   Socioeconomic History   Marital status: Single    Spouse name: Not on file   Number of children: Not on file   Years of education: Not on file   Highest education level: Not on file  Occupational History   Not on file  Tobacco Use   Smoking status: Never   Smokeless tobacco: Never  Vaping Use   Vaping status: Former   Substances: Nicotine, Flavoring  Substance and Sexual Activity   Alcohol use: Yes    Comment: occ   Drug use: Yes    Types: Marijuana   Sexual activity: Not on file  Other Topics Concern   Not on file  Social History Narrative   Not on file   Social Drivers of Health   Financial Resource Strain: Not on file  Food Insecurity: Not on file  Transportation Needs: Not on file  Physical Activity: Not on file  Stress: Not on file  Social Connections: Not  on file  Intimate Partner Violence: Not on file     PHYSICAL EXAM  GENERAL EXAM/CONSTITUTIONAL: Vitals:  Vitals:   08/31/23 1408  BP: 138/83  Pulse: 71  Weight: 154 lb (69.9 kg)  Height: 5\' 11"  (1.803 m)   Body mass index is 21.48 kg/m. Wt Readings from Last 3 Encounters:  08/31/23 154 lb (69.9 kg)  10/28/21 140 lb (63.5 kg)  04/19/21 150 lb (68 kg)   Patient is in no distress; well developed, nourished and groomed; neck is supple  MUSCULOSKELETAL: Gait, strength, tone, movements noted in Neurologic exam below  NEUROLOGIC: MENTAL STATUS:      No data to display         awake, alert, oriented to person, place and  time recent and remote memory intact normal attention and concentration language fluent, comprehension intact, naming intact fund of knowledge appropriate  CRANIAL NERVE:  2nd, 3rd, 4th, 6th - Visual fields full to confrontation, extraocular muscles intact, no nystagmus 5th - facial sensation symmetric 7th - There is left upper and lower facial weakness consistent with Bell's Palsy  8th - hearing intact 9th - palate elevates symmetrically, uvula midline 11th - shoulder shrug symmetric 12th - tongue protrusion midline  MOTOR:  normal bulk and tone, full strength in the BUE, BLE  SENSORY:  normal and symmetric to light touch  COORDINATION:  finger-nose-finger, fine finger movements normal  GAIT/STATION:  normal     DIAGNOSTIC DATA (LABS, IMAGING, TESTING) - I reviewed patient records, labs, notes, testing and imaging myself where available.  Lab Results  Component Value Date   WBC 13.0 (H) 04/19/2021   HGB 14.6 04/19/2021   HCT 45.2 04/19/2021   MCV 85.6 04/19/2021   PLT 292 04/19/2021      Component Value Date/Time   NA 134 (L) 04/19/2021 0532   K 3.7 04/19/2021 0532   CL 100 04/19/2021 0532   CO2 24 04/19/2021 0532   GLUCOSE 100 (H) 04/19/2021 0532   BUN 15 04/19/2021 0532   CREATININE 1.02 04/19/2021 0532   CALCIUM 8.9 04/19/2021 0532   PROT 7.4 04/19/2021 0532   ALBUMIN 4.5 04/19/2021 0532   AST 23 04/19/2021 0532   ALT 29 04/19/2021 0532   ALKPHOS 105 04/19/2021 0532   BILITOT 0.9 04/19/2021 0532   GFRNONAA >60 04/19/2021 0532   No results found for: "CHOL", "HDL", "LDLCALC", "LDLDIRECT", "TRIG", "CHOLHDL" No results found for: "HGBA1C" No results found for: "VITAMINB12" No results found for: "TSH"   ASSESSMENT AND PLAN  43 y.o. year old male with history of Bell's palsy diagnosed more than 10 years ago who is presenting for evaluation of the Bell's palsy.  Patient tells me at diagnosis he was treated with prednisone, he did have recovery but it  is incomplete.  Denies any recurrent Bell's palsy on the right, and denies any Bell's palsy affecting the left side of her face. I have told patient that he did have Bell's palsy with incomplete recovery.  He was treated with prednisone at the start of the inflammation and there are no needs to repeat treatment.  At this stage, there is no need for brain imaging.  Most patient with Bell's palsy will have a complete recovery but there is a small percentage who will not have full recovery and unfortunately he is among that small percentage.  Plan for now is to continue following up with PCP and to go to the hospital if he experiences any facial weakness involving left side of  his face or worsening weakness on the right to the point that he cannot drink water or brush his teeth or he is unable to close his right eye fully.  He voiced understanding.   1. Bell's palsy      Patient Instructions  Continue to follow up with PCP  Return as needed   No orders of the defined types were placed in this encounter.   No orders of the defined types were placed in this encounter.   Return if symptoms worsen or fail to improve.    Cassandra Cleveland, MD 08/31/2023, 2:45 PM  [facility] [address] [place], [place] [address] [phone]

## 2023-08-31 NOTE — Patient Instructions (Signed)
Continue to follow up with PCP  Return as needed  

## 2024-02-08 ENCOUNTER — Ambulatory Visit
Admission: RE | Admit: 2024-02-08 | Discharge: 2024-02-08 | Disposition: A | Discharge status: Home / Self Care | Source: Ambulatory Visit | Attending: Family Medicine | Admitting: Family Medicine

## 2024-02-08 ENCOUNTER — Other Ambulatory Visit: Payer: Self-pay | Admitting: Family Medicine

## 2024-02-08 DIAGNOSIS — M542 Cervicalgia: Secondary | ICD-10-CM

## 2024-04-20 ENCOUNTER — Ambulatory Visit (INDEPENDENT_AMBULATORY_CARE_PROVIDER_SITE_OTHER): Payer: Self-pay | Admitting: Orthopaedic Surgery

## 2024-04-20 ENCOUNTER — Other Ambulatory Visit: Payer: Self-pay

## 2024-04-20 DIAGNOSIS — M25562 Pain in left knee: Secondary | ICD-10-CM

## 2024-04-20 DIAGNOSIS — G8929 Other chronic pain: Secondary | ICD-10-CM

## 2024-04-20 NOTE — Progress Notes (Signed)
 "  Office Visit Note   Patient: Dan Hall           Date of Birth: 05/29/1980           MRN: [id number] Visit Date: 04/20/2024              Requested by: Picarra, Emerald G, NP [phone] [place],  [place] [address] PCP: Pcp, No   Assessment & Plan: Visit Diagnoses:  1. Chronic pain of left knee     Plan: Impression is of the left knee pain.  He finished prednisone Dosepak a month ago and has not had any recurrence of pain.  His exam is benign.  Follow-up as needed.  Follow-Up Instructions: Return if symptoms worsen or fail to improve.   Orders:  Orders Placed This Encounter  Procedures   XR KNEE 3 VIEW LEFT   No orders of the defined types were placed in this encounter.     Procedures: No procedures performed   Clinical Data: No additional findings.   Subjective: Chief Complaint  Patient presents with   Left Knee - Pain    HPI Patient is a 44 year old gentleman here for evaluation of left knee pain that is mainly localized to the posterior aspect.  He reports his symptoms have greatly improved.  He required 2 rounds of prednisone which he finished about a month ago.  He denies any swelling.  He rates his pain a 0-1 out of 10. Review of Systems  Constitutional: Negative.   HENT: Negative.    Eyes: Negative.   Respiratory: Negative.    Cardiovascular: Negative.   Gastrointestinal: Negative.   Endocrine: Negative.   Genitourinary: Negative.   Skin: Negative.   Allergic/Immunologic: Negative.   Neurological: Negative.   Hematological: Negative.   Psychiatric/Behavioral: Negative.    All other systems reviewed and are negative.    Objective: Vital Signs: There were no vitals taken for this visit.  Physical Exam Vitals and nursing note reviewed.  Constitutional:      Appearance: He is well-developed.  HENT:     Head: Normocephalic and atraumatic.  Eyes:     Pupils: Pupils are equal, round, and reactive to light.  Pulmonary:      Effort: Pulmonary effort is normal.  Abdominal:     Palpations: Abdomen is soft.  Musculoskeletal:        General: Normal range of motion.     Cervical back: Neck supple.  Skin:    General: Skin is warm.  Neurological:     Mental Status: He is alert and oriented to person, place, and time.  Psychiatric:        Behavior: Behavior normal.        Thought Content: Thought content normal.        Judgment: Judgment normal.     Ortho Exam Exam of the left knee is unremarkable. Specialty Comments:  No specialty comments available.  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  Family History  Problem Relation Age of Onset   Healthy Mother     No past surgical history on  file. Social History   Occupational History   Not on file  Tobacco Use   Smoking status: Never   Smokeless tobacco: Never  Vaping Use   Vaping status: Former   Substances: Nicotine, Flavoring  Substance and Sexual Activity   Alcohol use: Yes    Comment: occ   Drug use: Yes    Types: Marijuana   Sexual activity: Not on file        "

## 2024-04-25 ENCOUNTER — Ambulatory Visit: Payer: Self-pay | Admitting: "Endocrinology

## 2024-06-01 ENCOUNTER — Ambulatory Visit: Admitting: "Endocrinology
